# Patient Record
Sex: Male | Born: 1995 | Race: Black or African American | Hispanic: No | Marital: Single | Smoking: Never smoker
Health system: Southern US, Community
[De-identification: ages and names within clinical notes are randomized; demographics above are authoritative.]

---
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
## 2015-09-05 ENCOUNTER — Emergency Department (INDEPENDENT_AMBULATORY_CARE_PROVIDER_SITE_OTHER): Payer: Federal, State, Local not specified - PPO

## 2015-09-05 ENCOUNTER — Encounter (HOSPITAL_COMMUNITY): Payer: Self-pay | Admitting: *Deleted

## 2015-09-05 ENCOUNTER — Emergency Department (INDEPENDENT_AMBULATORY_CARE_PROVIDER_SITE_OTHER)
Admission: EM | Admit: 2015-09-05 | Discharge: 2015-09-05 | Disposition: A | Discharge status: Home / Self Care | Payer: 59 | Source: Home / Self Care | Attending: Family Medicine | Admitting: Family Medicine

## 2015-09-05 DIAGNOSIS — J069 Acute upper respiratory infection, unspecified: Secondary | ICD-10-CM

## 2015-09-05 MED ORDER — AZITHROMYCIN 250 MG PO TABS: ORAL_TABLET | ORAL | Status: AC

## 2015-09-05 MED ORDER — IPRATROPIUM BROMIDE 0.06 % NA SOLN: 2.0000 | Freq: Four times a day (QID) | NASAL | Status: AC

## 2015-09-05 NOTE — ED Notes (Signed)
Pt  Reports   Symptoms  Of  Cough   /   Congested       X  sev  Weeks    Pt  Reports  A  Low  Grade  Temp  As  Well     Pt  Reports    Cough   Is  For  The  Most  Part  Non  Productive          Pt is  Sitting  Upright  On  The  Exam table  Speaking in  Complete  sentances

## 2015-09-05 NOTE — Discharge Instructions (Signed)
Drink plenty of fluids as discussed, use medicine as prescribed, and mucinex or delsym for cough. Return or see your doctor if further problems °

## 2015-09-05 NOTE — ED Provider Notes (Signed)
CSN: [id number]     Arrival date & time 09/05/15  1334 History   First MD Initiated Contact with Patient 09/05/15 1410     Chief Complaint  Patient presents with  . URI   (Consider location/radiation/quality/duration/timing/severity/associated sxs/prior Treatment) Patient is a 19 y.o. male presenting with URI. The history is provided by the patient.  URI Presenting symptoms: congestion, cough, fever and rhinorrhea   Severity:  Mild Onset quality:  Gradual Duration:  4 weeks Progression:  Unchanged Chronicity:  New Relieved by:  None tried Worsened by:  Nothing tried Ineffective treatments:  None tried Associated symptoms: no wheezing   Risk factors: no recent illness     History reviewed. No pertinent past medical history. History reviewed. No pertinent past surgical history. History reviewed. No pertinent family history. Social History  Substance Use Topics  . Smoking status: Never Smoker   . Smokeless tobacco: None  . Alcohol Use: No    Review of Systems  Constitutional: Positive for fever. Negative for chills.  HENT: Positive for congestion, postnasal drip and rhinorrhea.   Respiratory: Positive for cough. Negative for wheezing.   Cardiovascular: Negative.   Gastrointestinal: Negative.   All other systems reviewed and are negative.   Allergies  Review of patient's allergies indicates not on file.  Home Medications   Prior to Admission medications   Medication Sig Start Date End Date Taking? Authorizing Provider  azithromycin (ZITHROMAX Z-PAK) 250 MG tablet Take as directed on pack 09/05/15   Linna HoffJames D Kindl, MD  ipratropium (ATROVENT) 0.06 % nasal spray Place 2 sprays into both nostrils 4 (four) times daily. 09/05/15   Linna HoffJames D Kindl, MD   Meds Ordered and Administered this Visit  Medications - No data to display  BP 126/89 mmHg  Pulse 91  Temp(Src) 100 F (37.8 C) (Oral)  Resp 16  SpO2 98% No data found.   Physical Exam  Constitutional: He is  oriented to person, place, and time. He appears well-developed and well-nourished. No distress.  HENT:  Right Ear: External ear normal.  Left Ear: External ear normal.  Mouth/Throat: Oropharynx is clear and moist.  Neck: Normal range of motion. Neck supple.  Cardiovascular: Normal heart sounds and intact distal pulses.   Pulmonary/Chest: Effort normal and breath sounds normal.  Lymphadenopathy:    He has no cervical adenopathy.  Neurological: He is alert and oriented to person, place, and time.  Skin: Skin is warm and dry.  Nursing note and vitals reviewed.   ED Course  Procedures (including critical care time)  Labs Review Labs Reviewed - No data to display  Imaging Review Dg Chest 2 View  09/05/2015  CLINICAL DATA:  Sick for the past 2.5 weeks with cough, congestion and headache. EXAM: CHEST  2 VIEW COMPARISON:  None. FINDINGS: Normal cardiac silhouette and mediastinal contours. No focal parenchymal opacities. No pleural effusion pneumothorax. No evidence of edema. No acute osseous abnormalities. IMPRESSION: No acute cardiopulmonary disease. Specifically, no evidence of pneumonia. Electronically Signed   By: Simonne ComeJohn  Watts M.D.   On: 09/05/2015 14:36   X-rays reviewed and report per radiologist.   Visual Acuity Review  Right Eye Distance:   Left Eye Distance:   Bilateral Distance:    Right Eye Near:   Left Eye Near:    Bilateral Near:         MDM   1. URI (upper respiratory infection)        Linna HoffJames D Kindl, MD 09/06/15 2006

## 2017-08-04 IMAGING — DX DG CHEST 2V
3 series · 3 of 3 positions shown · non-contrast
Comparison: None.

CLINICAL DATA: Sick for the past 2.5 weeks with cough, congestion
and headache.

EXAM:
CHEST  2 VIEW

[chest pa]
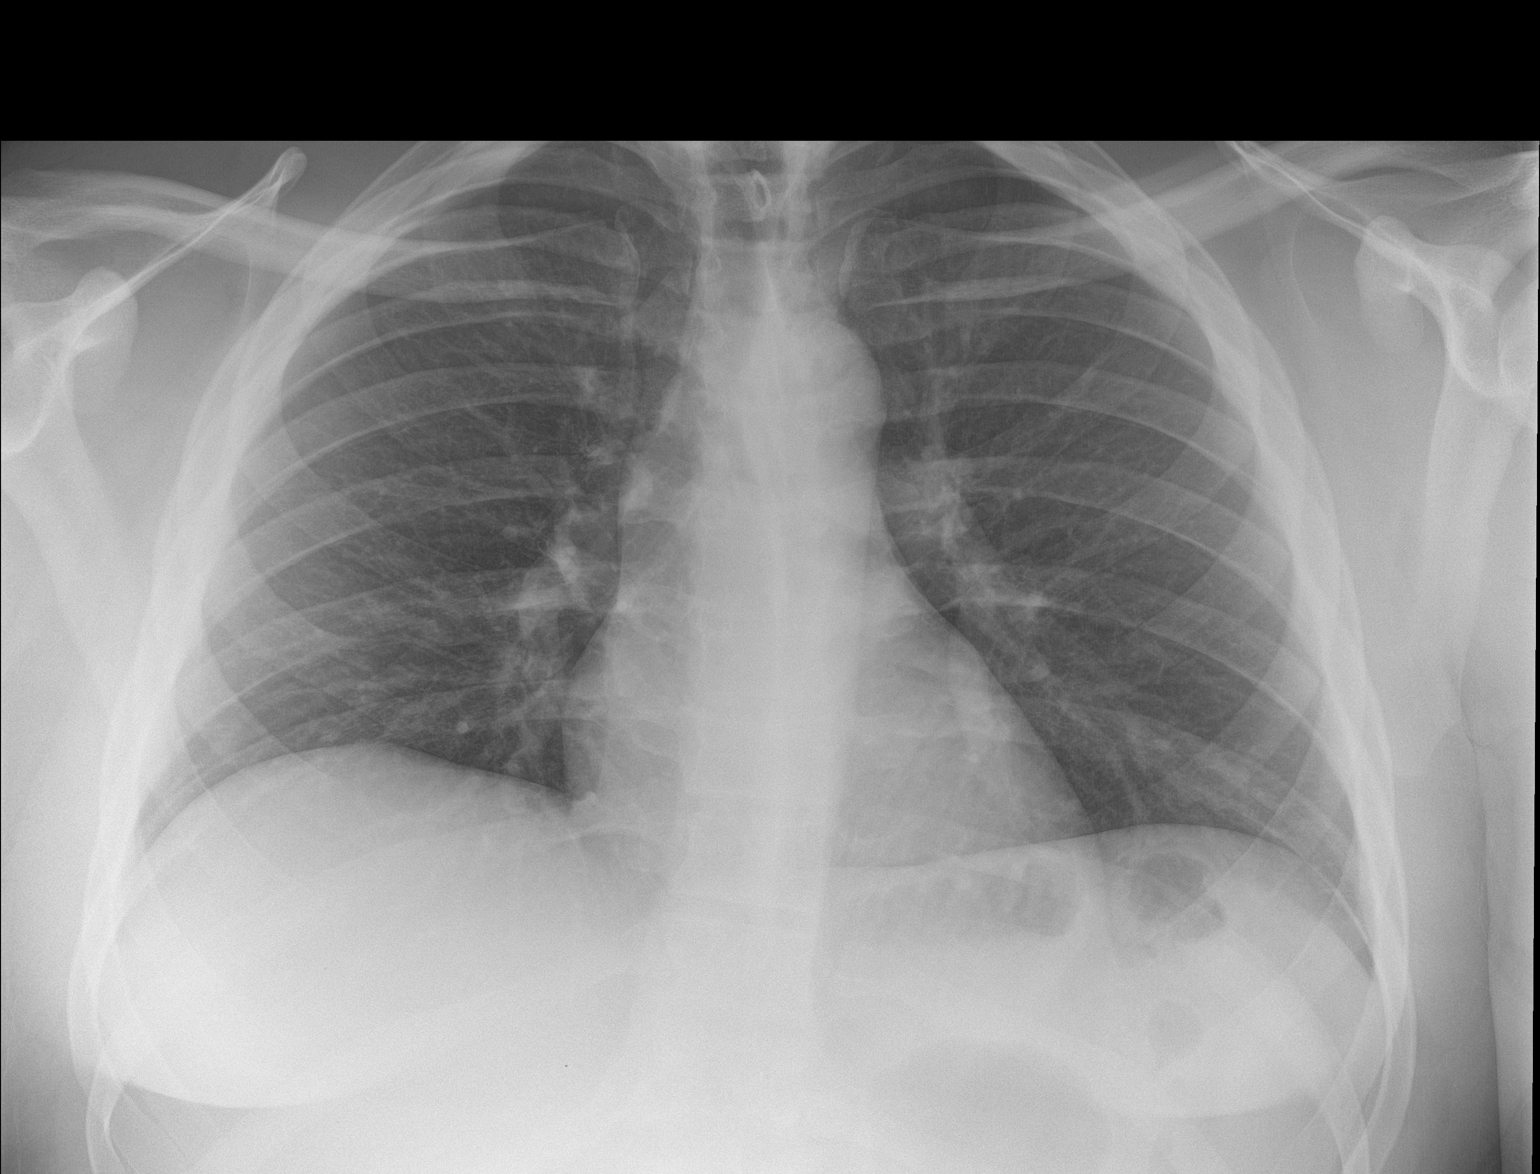

[chest lat (1 of 2)]
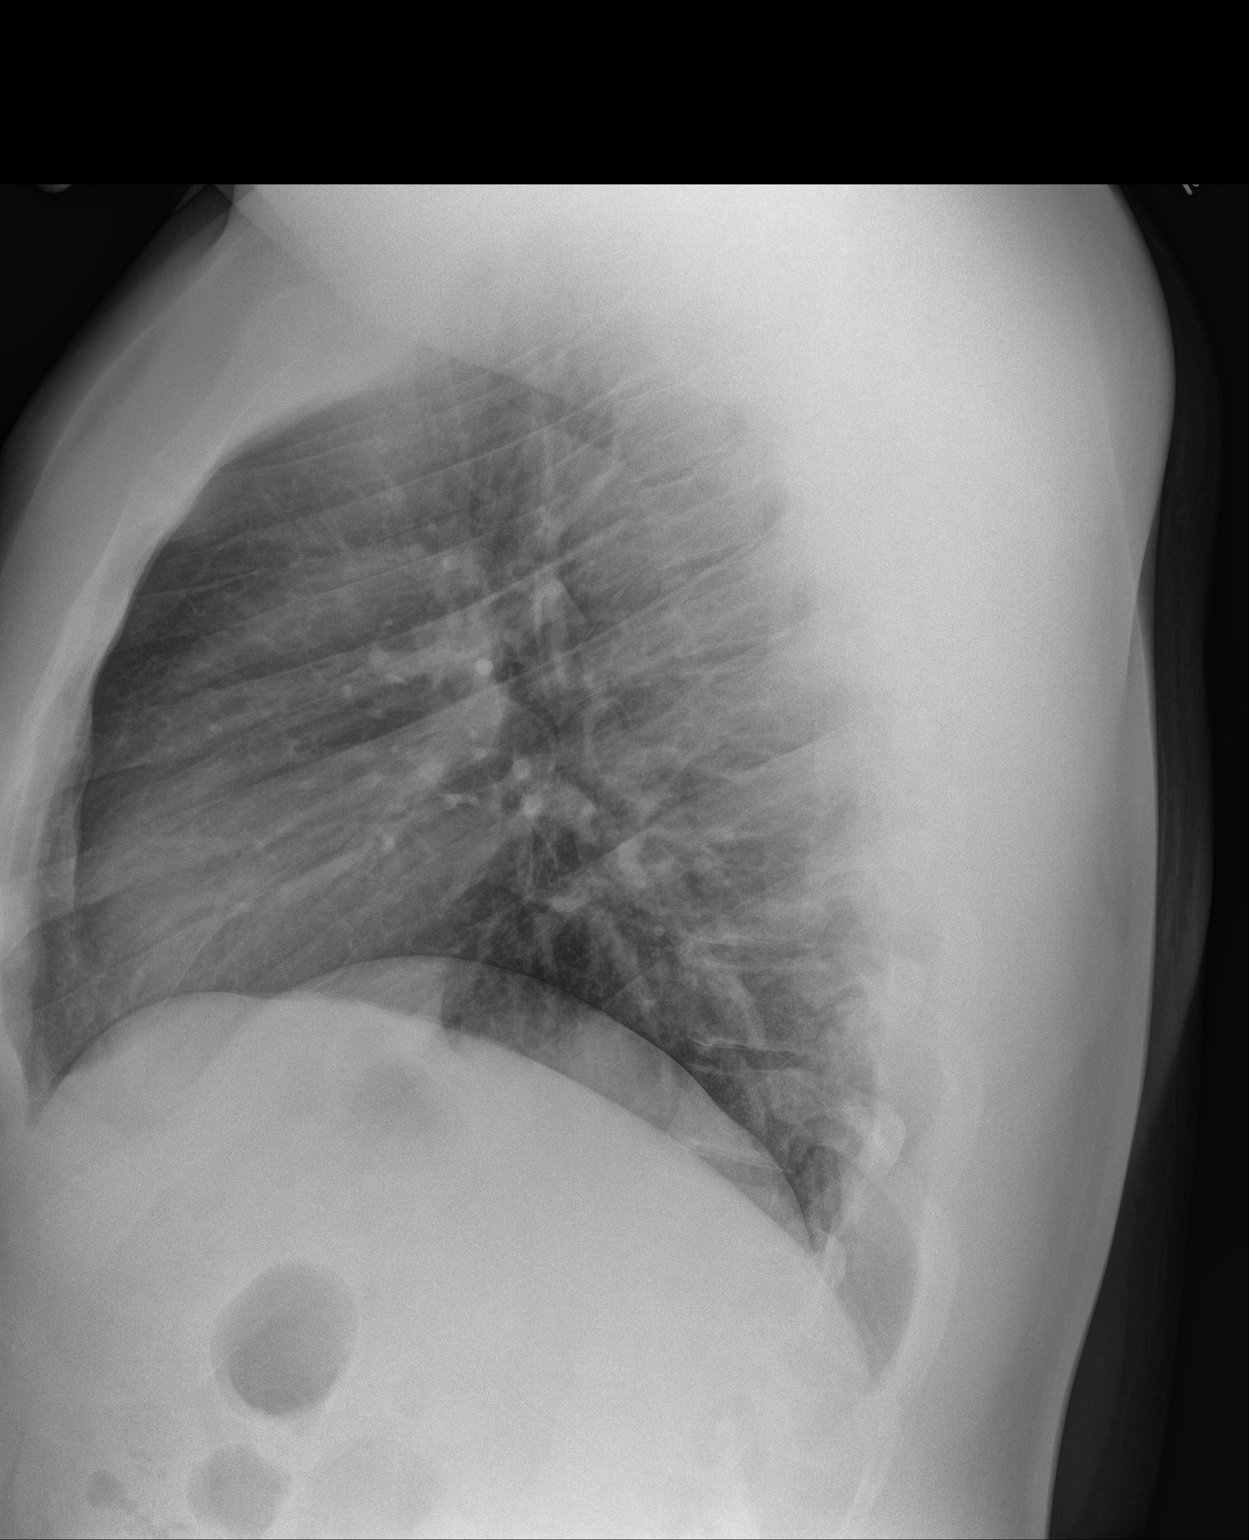

[chest lat (2 of 2)]
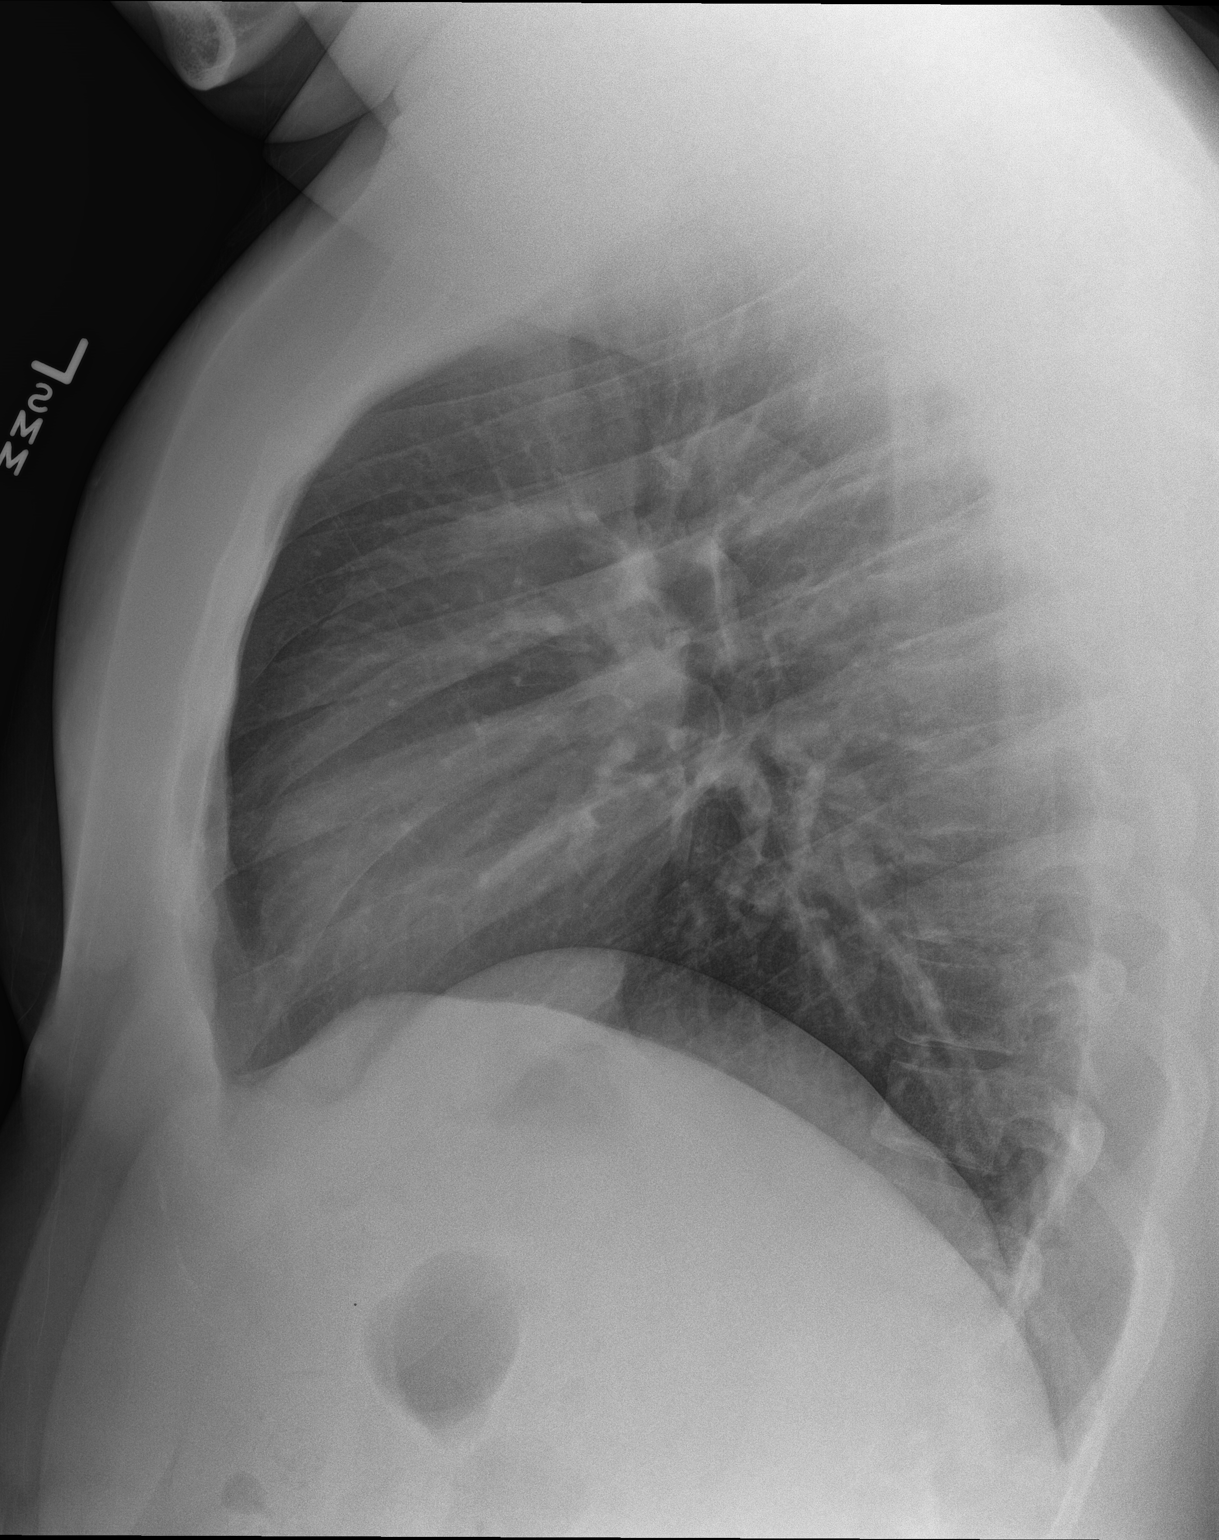

[3 of 3 positions shown; findings below may reference images not displayed]

FINDINGS: Normal cardiac silhouette and mediastinal contours. No focal
parenchymal opacities. No pleural effusion pneumothorax. No evidence
of edema. No acute osseous abnormalities.
IMPRESSION: No acute cardiopulmonary disease. Specifically, no evidence of
pneumonia.
